# Patient Record
Sex: Male | Born: 1990 | Race: White | Hispanic: No | Marital: Single | Smoking: Never smoker
Health system: Southern US, Community
[De-identification: ages and names within clinical notes are randomized; demographics above are authoritative.]

## PROBLEM LIST (undated history)

## (undated) DIAGNOSIS — N200 Calculus of kidney: Secondary | ICD-10-CM

## (undated) DIAGNOSIS — J45909 Unspecified asthma, uncomplicated: Secondary | ICD-10-CM

## (undated) DIAGNOSIS — G43909 Migraine, unspecified, not intractable, without status migrainosus: Secondary | ICD-10-CM

---

## 2020-05-02 ENCOUNTER — Emergency Department (INDEPENDENT_AMBULATORY_CARE_PROVIDER_SITE_OTHER)
Admission: RE | Admit: 2020-05-02 | Discharge: 2020-05-02 | Disposition: A | Discharge status: Home / Self Care | Payer: BC Managed Care – PPO | Source: Ambulatory Visit

## 2020-05-02 ENCOUNTER — Emergency Department (INDEPENDENT_AMBULATORY_CARE_PROVIDER_SITE_OTHER): Payer: BC Managed Care – PPO

## 2020-05-02 ENCOUNTER — Other Ambulatory Visit: Payer: Self-pay

## 2020-05-02 VITALS — BP 131/85 | HR 96 | Temp 98.8°F | Resp 18 | Ht 70.0 in | Wt 130.0 lb

## 2020-05-02 DIAGNOSIS — R109 Unspecified abdominal pain: Secondary | ICD-10-CM | POA: Diagnosis not present

## 2020-05-02 DIAGNOSIS — R110 Nausea: Secondary | ICD-10-CM

## 2020-05-02 DIAGNOSIS — K529 Noninfective gastroenteritis and colitis, unspecified: Secondary | ICD-10-CM

## 2020-05-02 DIAGNOSIS — R1084 Generalized abdominal pain: Secondary | ICD-10-CM

## 2020-05-02 HISTORY — DX: Migraine, unspecified, not intractable, without status migrainosus: G43.909

## 2020-05-02 HISTORY — DX: Calculus of kidney: N20.0

## 2020-05-02 HISTORY — DX: Unspecified asthma, uncomplicated: J45.909

## 2020-05-02 LAB — POCT URINALYSIS DIP (MANUAL ENTRY)
Bilirubin, UA: NEGATIVE
Glucose, UA: NEGATIVE mg/dL
Ketones, POC UA: NEGATIVE mg/dL
Leukocytes, UA: NEGATIVE
Nitrite, UA: NEGATIVE
Protein Ur, POC: NEGATIVE mg/dL
Spec Grav, UA: >=1.03 — AB (ref 1.010–1.025)
Urobilinogen, UA: 0.2 E.U./dL
pH, UA: 6 (ref 5.0–8.0)

## 2020-05-02 MED ORDER — ONDANSETRON HCL 4 MG PO TABS: 4.0000 mg | ORAL_TABLET | Freq: Four times a day (QID) | ORAL | 0 refills | Status: AC

## 2020-05-02 MED ORDER — CYCLOBENZAPRINE HCL 10 MG PO TABS: 10.0000 mg | ORAL_TABLET | Freq: Two times a day (BID) | ORAL | 0 refills | Status: AC | PRN

## 2020-05-02 NOTE — ED Provider Notes (Signed)
Randy Nguyen C[facility]    CSN: [id number] Arrival date & time: 05/02/20  0955      History   Chief Complaint Chief Complaint  Patient presents with  . Abdominal Pain  . Flank Pain    Left    HPI Randy Nguyen is a 30 y.o. male.   HPI  Patient presents today with abdominal pain and left flank pain with intermittent nausea.  Patient has a history of left nephrolithiasis back in 2018.  He is concerned for possible recurrent renal stone.  He denies dysuria. Pain is localized to the left flank extending into the left mid back. He has not taken any medication to alleviate back pain. Past Medical History:  Diagnosis Date  . Asthma    primarily childhood asthma  . Kidney stones   . Migraines     There are no problems to display for this patient.   History reviewed. No pertinent surgical history.     Home Medications    Prior to Admission medications   Not on File    Family History Family History  Problem Relation Age of Onset  . Healthy Mother   . Healthy Father     Social History Social History   Tobacco Use  . Smoking status: Never Smoker  . Smokeless tobacco: Never Used  Vaping Use  . Vaping Use: Never used  Substance Use Topics  . Alcohol use: Not Currently  . Drug use: Not Currently     Allergies   Patient has no known allergies.   Review of Systems Review of Systems Pertinent negatives listed in HPI   Physical Exam Triage Vital Signs ED Triage Vitals  Enc Vitals Group     BP 05/02/20 1000 131/85     Pulse Rate 05/02/20 1000 96     Resp 05/02/20 1000 18     Temp 05/02/20 1000 98.8 F (37.1 C)     Temp Source 05/02/20 1000 Oral     SpO2 05/02/20 1000 100 %     Weight 05/02/20 0959 130 lb (59 kg)     Height 05/02/20 0959 5\' 10"  (1.778 m)     Head Circumference --      Peak Flow --      Pain Score 05/02/20 0958 6     Pain Loc --      Pain Edu? --      Excl. in GC? --    No data found.  Updated Vital Signs BP 131/85 (BP  Location: Right Arm)   Pulse 96   Temp 98.8 F (37.1 C) (Oral)   Resp 18   Ht 5\' 10"  (1.778 m)   Wt 130 lb (59 kg)   SpO2 100%   BMI 18.65 kg/m   Visual Acuity Right Eye Distance:   Left Eye Distance:   Bilateral Distance:    Right Eye Near:   Left Eye Near:    Bilateral Near:     Physical Exam Constitutional:      Appearance: He is not ill-appearing or toxic-appearing.  HENT:     Head: Normocephalic.     Nose: Nose normal.  Cardiovascular:     Rate and Rhythm: Normal rate.  Pulmonary:     Effort: Pulmonary effort is normal.     Breath sounds: Normal breath sounds.  Abdominal:     General: Abdomen is flat.     Tenderness: There is no abdominal tenderness. There is left CVA tenderness.  Musculoskeletal:  Back:  Skin:    General: Skin is warm.     Capillary Refill: Capillary refill takes less than 2 seconds.  Neurological:     General: No focal deficit present.     Mental Status: He is alert.     Gait: Gait normal.      UC Treatments / Results  Labs (all labs ordered are listed, but only abnormal results are displayed) Labs Reviewed  POCT URINALYSIS DIP (MANUAL ENTRY)    EKG   Radiology CT Renal Stone Study  Result Date: 05/02/2020 CLINICAL DATA:  Left flank pain, nausea EXAM: CT ABDOMEN AND PELVIS WITHOUT CONTRAST TECHNIQUE: Multidetector CT imaging of the abdomen and pelvis was performed following the standard protocol without IV contrast. COMPARISON:  03/25/2017 FINDINGS: Lower chest: The visualized lung bases are clear. The visualized heart and pericardium are unremarkable Hepatobiliary: No focal liver abnormality is seen. No gallstones, gallbladder wall thickening, or biliary dilatation. Pancreas: Unremarkable Spleen: Unremarkable Adrenals/Urinary Tract: Adrenal glands are unremarkable. Kidneys are normal, without renal calculi, focal lesion, or hydronephrosis. Bladder is unremarkable. Stomach/Bowel: There is mild sigmoid diverticulosis. Stomach,  small bowel, and large bowel are otherwise unremarkable. Appendix normal. No free intraperitoneal gas or fluid. Vascular/Lymphatic: No significant vascular findings are present. No enlarged abdominal or pelvic lymph nodes. Reproductive: Prostate is unremarkable. Other: Small bilateral fat containing inguinal hernias are present. Tiny broad-based fat containing umbilical hernia is present. Rectum unremarkable. Musculoskeletal: No acute bone abnormality IMPRESSION: No acute intra-abdominal pathology identified. No definite radiographic explanation for the patient's reported left flank pain. Electronically Signed   By: Helyn Numbers MD   On: 05/02/2020 10:51    Procedures Procedures (including critical care time)  Medications Ordered in UC Medications - No data to display  Initial Impression / Assessment and Plan / UC Course  I have reviewed the triage vital signs and the nursing notes.  Pertinent labs & imaging results that were available during my care of the patient were reviewed by me and considered in my medical decision making (see chart for details).     Patient presents with a history of recurrent renal stones with acute onset of left flank pain with some left sided back pain with nausea and abdominal pain x1 day yesterday. UA significant for trace amount of RBCs. Renal stone study completed and was negative for any renal lithiasis or any other abnormalities. Treating symptoms of nausea with Zofran likely related to gastroenteritis.  GI rest advised and recommended a bland diet until symptoms resolve.  Strict follow-up if symptoms worsen or do not improve. Final Clinical Impressions(s) / UC Diagnoses   Final diagnoses:  Left flank pain  Generalized abdominal pain  Nausea without vomiting  Gastroenteritis     Discharge Instructions     CT scan was negative for any renal stone or any abdominal pathology.  Symptoms are consistent with that of possible gastroenteritis and acute back  pain.  I have prescribed Zofran for management of nausea.  Cyclobenzaprine for back pain.  Also provided bland diet recommendation to help with management of abdominal pain and nausea.    ED Prescriptions    Medication Sig Dispense Auth. Provider   ondansetron (ZOFRAN) 4 MG tablet Take 1 tablet (4 mg total) by mouth every 6 (six) hours. 12 tablet Bing Neighbors, FNP   cyclobenzaprine (FLEXERIL) 10 MG tablet Take 1 tablet (10 mg total) by mouth 2 (two) times daily as needed (back pain). 20 tablet Bing Neighbors, FNP  PDMP not reviewed this encounter.   Bing Neighbors, FNP 05/02/20 [phone]

## 2020-05-02 NOTE — ED Triage Notes (Signed)
Pt presents to Urgent Care with c/o abdominal pain and L flank pain since yesterday; has had intermittent nausea. Reports he has had kidney stones in the past and this is what he is concerned about. Denies dysuria or obvious hematuria.

## 2020-05-02 NOTE — Discharge Instructions (Addendum)
CT scan was negative for any renal stone or any abdominal pathology.  Symptoms are consistent with that of possible gastroenteritis and acute back pain.  I have prescribed Zofran for management of nausea.  Cyclobenzaprine for back pain.  Also provided bland diet recommendation to help with management of abdominal pain and nausea.

## 2022-01-17 IMAGING — CT CT RENAL STONE PROTOCOL
2 of 4 series · 16 of 46 positions shown, 18 images · non-contrast
Comparison: 03/25/2017

CLINICAL DATA: Left flank pain, nausea

EXAM:
CT ABDOMEN AND PELVIS WITHOUT CONTRAST
TECHNIQUE: Multidetector CT imaging of the abdomen and pelvis was performed
following the standard protocol without IV contrast.

[Series 2: axial st · axial · 0.79mm/px · z∈[-526,-70]mm · 13 of 99 slices shown, 15 images]
[im 4/99  soft-tissue]
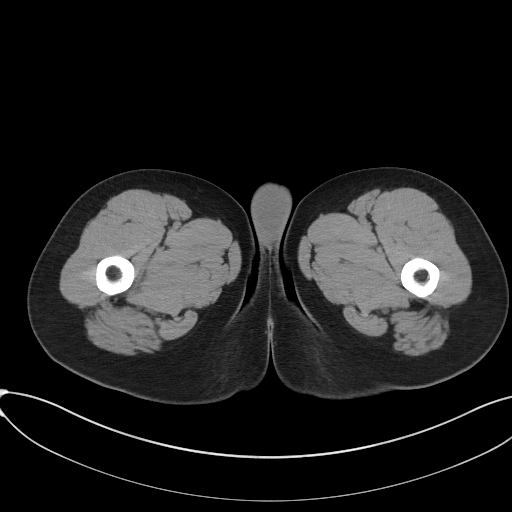
[im 4/99  bone]
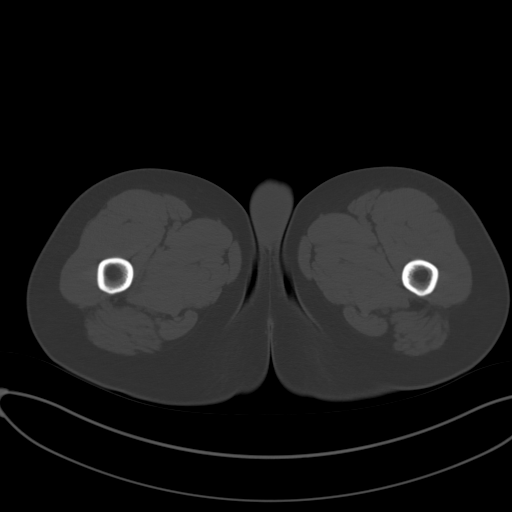
[im 12/99  soft-tissue]
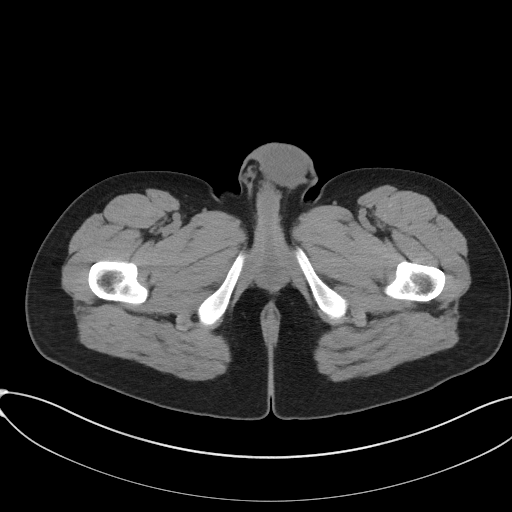
[im 20/99  soft-tissue]
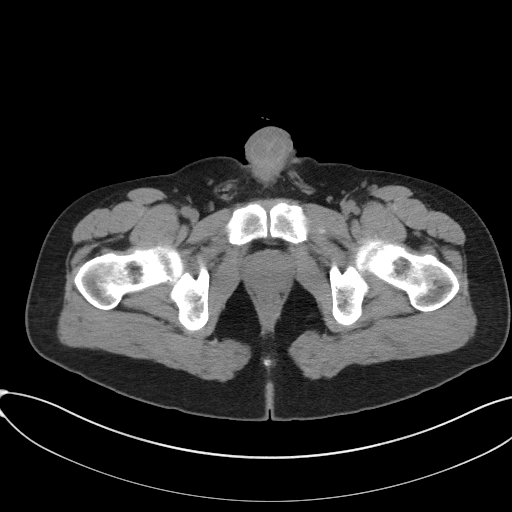
[im 28/99  soft-tissue]
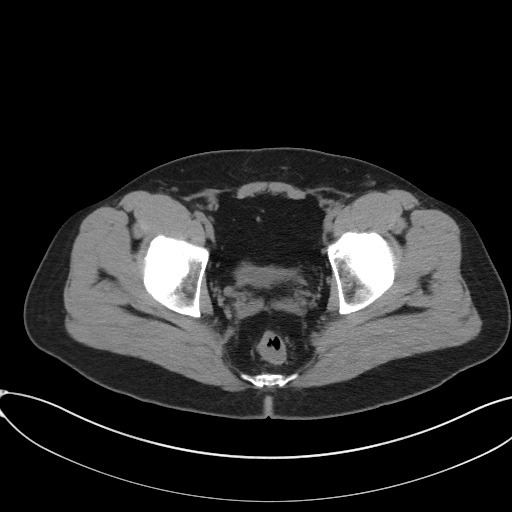
[im 36/99  soft-tissue]
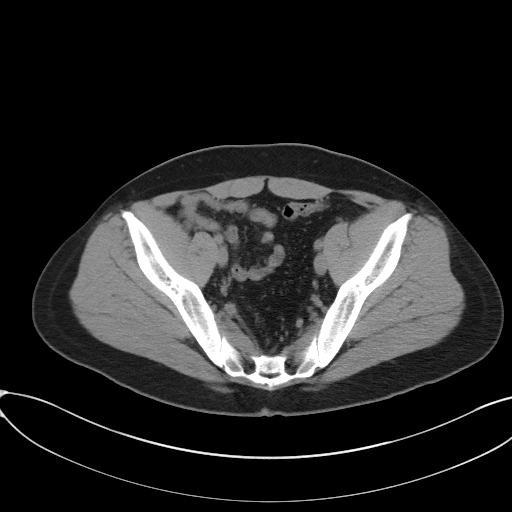
[im 44/99  soft-tissue]
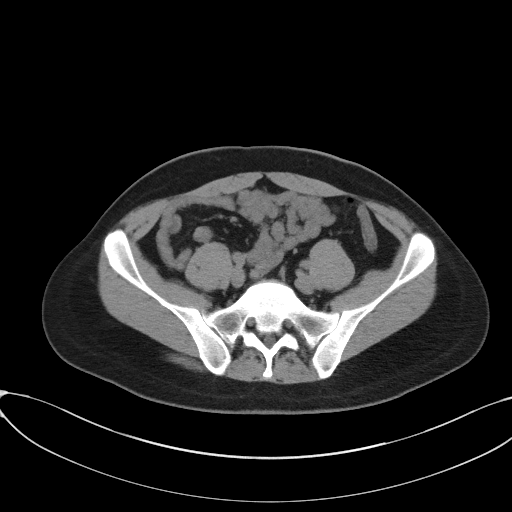
[im 51/99  soft-tissue]
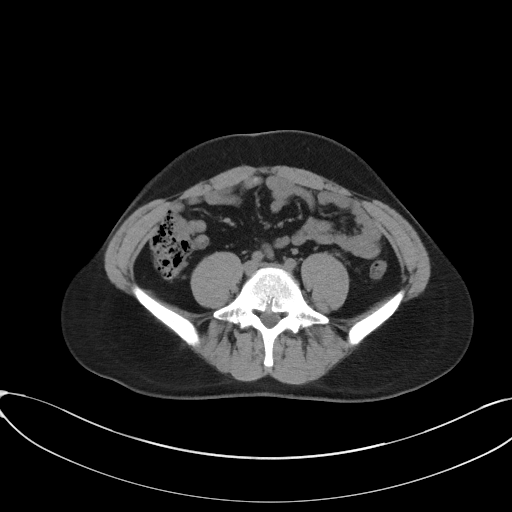
[im 55/99  soft-tissue]
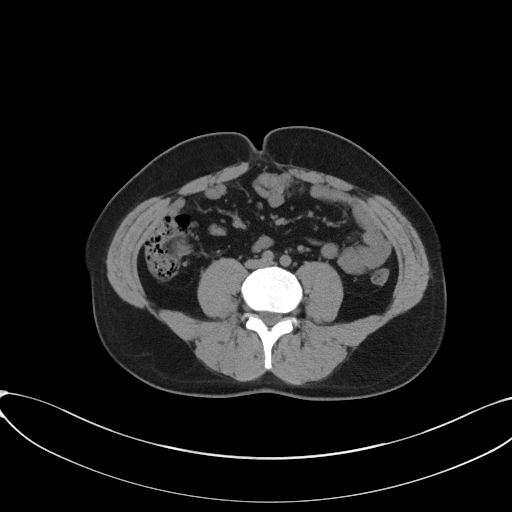
[im 63/99  soft-tissue]
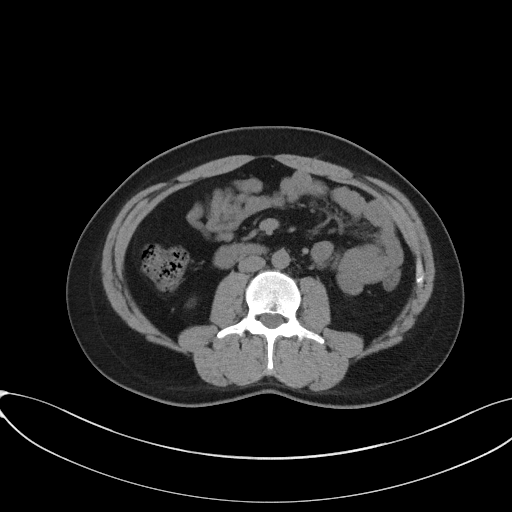
[im 63/99  bone]
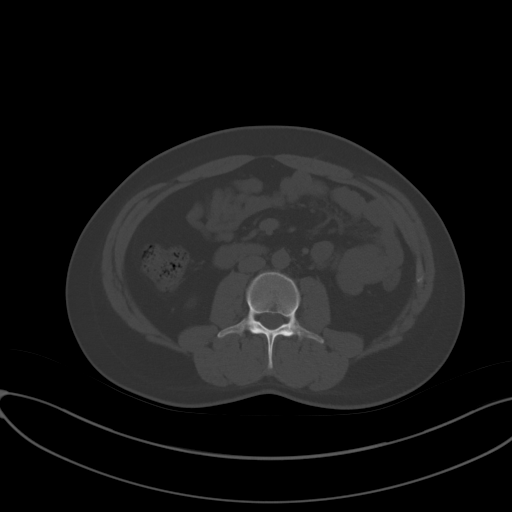
[im 71/99  soft-tissue]
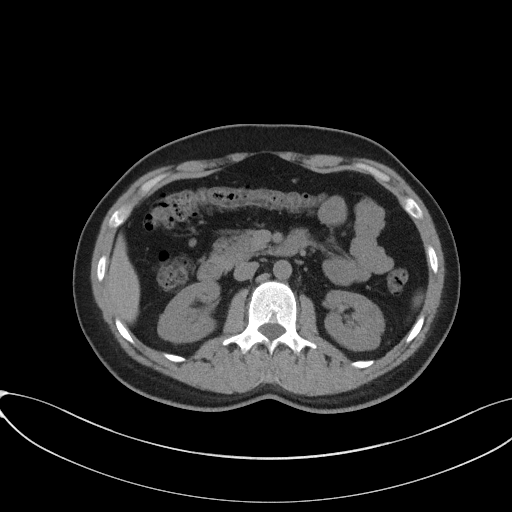
[im 79/99  soft-tissue]
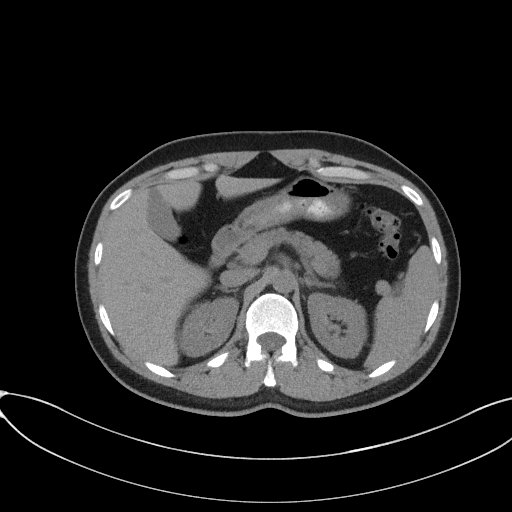
[im 87/99  soft-tissue]
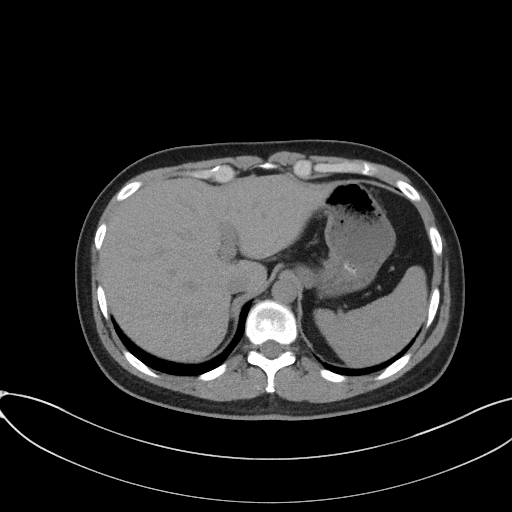
[im 95/99  soft-tissue]
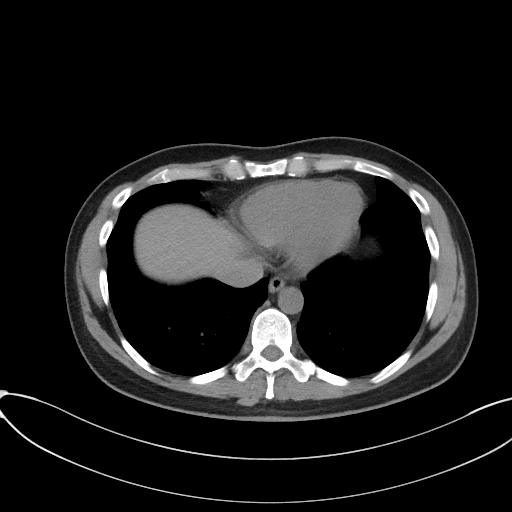

[Series 5: coronal st · coronal · 0.69mm/px · 3 of 79 slices shown]
[im 27/79  soft-tissue]
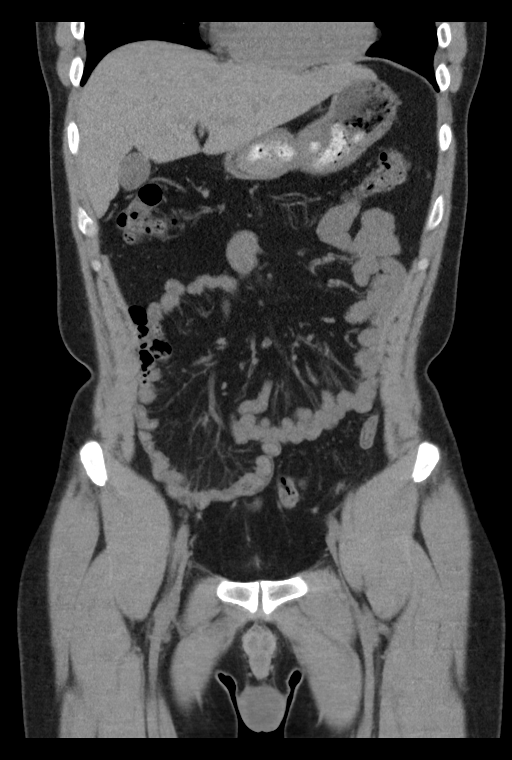
[im 35/79  soft-tissue]
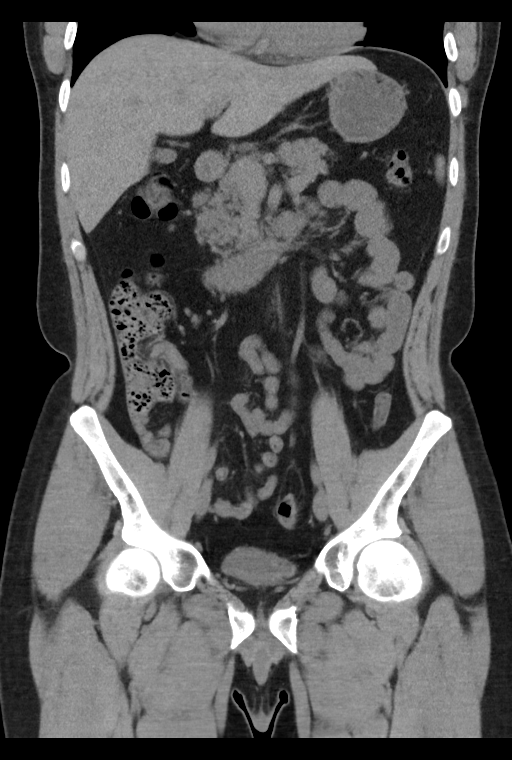
[im 44/79  soft-tissue]
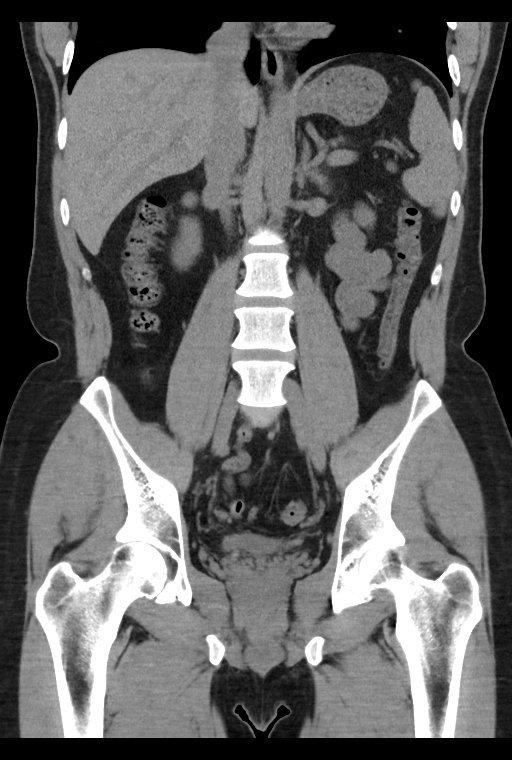

[16 of 46 positions shown; findings below may reference images not displayed]

FINDINGS: Lower chest: The visualized lung bases are clear. The visualized
heart and pericardium are unremarkable

Hepatobiliary: No focal liver abnormality is seen. No gallstones,
gallbladder wall thickening, or biliary dilatation.

Pancreas: Unremarkable

Spleen: Unremarkable

Adrenals/Urinary Tract: Adrenal glands are unremarkable. Kidneys are
normal, without renal calculi, focal lesion, or hydronephrosis.
Bladder is unremarkable.

Stomach/Bowel: There is mild sigmoid diverticulosis. Stomach, small
bowel, and large bowel are otherwise unremarkable. Appendix normal.
No free intraperitoneal gas or fluid.

Vascular/Lymphatic: No significant vascular findings are present. No
enlarged abdominal or pelvic lymph nodes.

Reproductive: Prostate is unremarkable.

Other: Small bilateral fat containing inguinal hernias are present.
Tiny broad-based fat containing umbilical hernia is present. Rectum
unremarkable.

Musculoskeletal: No acute bone abnormality
IMPRESSION: No acute intra-abdominal pathology identified. No definite
radiographic explanation for the patient's reported left flank pain.
